# Patient Record
Sex: Female | Born: 1955 | Race: White | Hispanic: No | Marital: Married | Smoking: Never smoker
Health system: Southern US, Community
[De-identification: ages and names within clinical notes are randomized; demographics above are authoritative.]

## PROBLEM LIST (undated history)

## (undated) DIAGNOSIS — C801 Malignant (primary) neoplasm, unspecified: Secondary | ICD-10-CM

## (undated) HISTORY — PX: LUNG SURGERY: SHX703

## (undated) HISTORY — PX: TONSILLECTOMY: SUR1361

## (undated) HISTORY — PX: APPENDECTOMY: SHX54

---

## 2020-06-11 ENCOUNTER — Emergency Department (HOSPITAL_COMMUNITY)
Admission: EM | Admit: 2020-06-11 | Discharge: 2020-06-11 | Disposition: A | Discharge status: Home / Self Care | Payer: Medicare Other | Attending: Emergency Medicine | Admitting: Emergency Medicine

## 2020-06-11 ENCOUNTER — Other Ambulatory Visit: Payer: Self-pay

## 2020-06-11 ENCOUNTER — Encounter (HOSPITAL_COMMUNITY): Payer: Self-pay | Admitting: Emergency Medicine

## 2020-06-11 DIAGNOSIS — M79604 Pain in right leg: Secondary | ICD-10-CM | POA: Diagnosis present

## 2020-06-11 DIAGNOSIS — Z955 Presence of coronary angioplasty implant and graft: Secondary | ICD-10-CM | POA: Insufficient documentation

## 2020-06-11 DIAGNOSIS — G8918 Other acute postprocedural pain: Secondary | ICD-10-CM | POA: Diagnosis not present

## 2020-06-11 DIAGNOSIS — Z5321 Procedure and treatment not carried out due to patient leaving prior to being seen by health care provider: Secondary | ICD-10-CM | POA: Insufficient documentation

## 2020-06-11 HISTORY — DX: Malignant (primary) neoplasm, unspecified: C80.1

## 2020-06-11 NOTE — ED Triage Notes (Signed)
Pt had a stent placed in her lung at [facility]. Pt c/o left leg pain that began after her surgery.

## 2020-06-12 ENCOUNTER — Emergency Department (HOSPITAL_COMMUNITY)
Admission: EM | Admit: 2020-06-12 | Discharge: 2020-06-12 | Disposition: A | Discharge status: Home / Self Care | Payer: Medicare Other | Attending: Emergency Medicine | Admitting: Emergency Medicine

## 2020-06-12 ENCOUNTER — Emergency Department (HOSPITAL_COMMUNITY): Payer: Medicare Other

## 2020-06-12 ENCOUNTER — Emergency Department (HOSPITAL_COMMUNITY)
Admission: EM | Admit: 2020-06-12 | Discharge: 2020-06-12 | Discharge status: Absent without leave | Payer: Medicare Other

## 2020-06-12 DIAGNOSIS — Z85841 Personal history of malignant neoplasm of brain: Secondary | ICD-10-CM | POA: Insufficient documentation

## 2020-06-12 DIAGNOSIS — Z85118 Personal history of other malignant neoplasm of bronchus and lung: Secondary | ICD-10-CM | POA: Insufficient documentation

## 2020-06-12 DIAGNOSIS — M79605 Pain in left leg: Secondary | ICD-10-CM

## 2020-06-12 DIAGNOSIS — R52 Pain, unspecified: Secondary | ICD-10-CM

## 2020-06-12 NOTE — ED Provider Notes (Signed)
[facility] EMERGENCY DEPARTMENT Provider Note   CSN: [id number] Arrival date & time: 06/12/20  1501     History Chief Complaint  Patient presents with  . Leg Pain    Holly Hill is a 64 y.o. female.  HPI    Patient with significant medical history of adenocarcinoma of the right lung, with right mainstem stent presents to the emergency department with chief complaint of left upper thigh pain.  Patient states pain started after her surgery in October (stent and right systemic lung), she describes a consistent throbbing sensation in her left upper thigh, pain does not radiate, it increases when she applies weight to it or touches it, she denies paresthesias or weakness in her lower extremities.  She denies recent injury to the area, has no history of DVTs or blood clots, denies any recent leg swelling.  She has been taking ibuprofen and Tylenol without any relief.  She denies fevers, chills, chest pain, shortness of breath, abdominal pain, flank pain, difficulty with urination, vaginal bleeding no vaginal discharge.  Patient states she is never experienced this before and has no alleviating factors.  Past Medical History:  Diagnosis Date  . Cancer ([place])    lung and brain     There are no problems to display for this patient.   Past Surgical History:  Procedure Laterality Date  . APPENDECTOMY    . LUNG SURGERY    . TONSILLECTOMY       OB History   No obstetric history on file.     No family history on file.  Social History   Tobacco Use  . Smoking status: Never Smoker  . Smokeless tobacco: Never Used  Vaping Use  . Vaping Use: Never used  Substance Use Topics  . Alcohol use: Never  . Drug use: Never    Home Medications Prior to Admission medications   Not on File    Allergies    Red dye  Review of Systems   Review of Systems  Constitutional: Negative for chills and fever.  HENT: Negative for congestion.   Eyes: Negative for visual disturbance.   Respiratory: Negative for shortness of breath.   Cardiovascular: Negative for chest pain.  Gastrointestinal: Negative for abdominal pain, diarrhea, nausea and vomiting.  Genitourinary: Negative for dysuria, enuresis and flank pain.  Musculoskeletal: Negative for back pain.       Patient midst to left thigh pain.  Skin: Negative for rash.  Neurological: Negative for headaches.  Hematological: Does not bruise/bleed easily.    Physical Exam Updated Vital Signs BP 116/74 (BP Location: Left Arm)   Pulse (!) 120   Temp 98 F (36.7 C) (Oral)   Resp 18   SpO2 100%   Physical Exam Vitals and nursing note reviewed.  Constitutional:      General: She is not in acute distress.    Appearance: She is not ill-appearing.  HENT:     Head: Normocephalic and atraumatic.     Nose: No congestion.  Eyes:     Conjunctiva/sclera: Conjunctivae normal.  Cardiovascular:     Rate and Rhythm: Regular rhythm. Tachycardia present.     Pulses: Normal pulses.     Heart sounds: No murmur heard. No friction rub. No gallop.   Pulmonary:     Effort: No respiratory distress.     Breath sounds: No wheezing, rhonchi or rales.  Abdominal:     Palpations: Abdomen is soft.     Tenderness: There is no abdominal tenderness.  Musculoskeletal:        General: Tenderness present.     Right lower leg: No edema.     Left lower leg: No edema.     Comments: Patient's left leg was evaluated she had tenderness to palpation along the anterior aspect of her mid to proximal femur, no deformities or crepitus noted.  She had full range of motion at her hip flexor, knee, ankle, toes.  Neurovascular fully intact bilaterally.  Patient is moving all 4 extremities out difficulty.  Skin:    General: Skin is warm and dry.  Neurological:     Mental Status: She is alert.     Comments: Patient is having no difficulty with word finding.  Psychiatric:        Mood and Affect: Mood normal.     ED Results / Procedures /  Treatments   Labs (all labs ordered are listed, but only abnormal results are displayed) Labs Reviewed - No data to display  EKG None  Radiology US Venous Img Lower Unilateral Left  Result Date: 06/12/2020 CLINICAL DATA:  64 year old female with left calf pain. History of recent thoracic surgery. EXAM: LEFT LOWER EXTREMITY VENOUS DOPPLER ULTRASOUND TECHNIQUE: Gray-scale sonography with graded compression, as well as color Doppler and duplex ultrasound were performed to evaluate the left lower extremity deep venous systems from the level of the common femoral vein and including the common femoral, femoral, profunda femoral, popliteal and calf veins including the posterior tibial, peroneal and gastrocnemius veins when visible. Spectral Doppler was utilized to evaluate flow at rest and with distal augmentation maneuvers in the common femoral, femoral and popliteal veins. The contralateral common femoral vein was also evaluated for comparison. COMPARISON:  None. FINDINGS: LEFT LOWER EXTREMITY Common Femoral Vein: No evidence of thrombus. Normal compressibility, respiratory phasicity and response to augmentation. Central Greater Saphenous Vein: No evidence of thrombus. Normal compressibility and flow on color Doppler imaging. Central Profunda Femoral Vein: No evidence of thrombus. Normal compressibility and flow on color Doppler imaging. Femoral Vein: No evidence of thrombus. Normal compressibility, respiratory phasicity and response to augmentation. Popliteal Vein: No evidence of thrombus. Normal compressibility, respiratory phasicity and response to augmentation. Calf Veins: No evidence of thrombus. Normal compressibility and flow on color Doppler imaging. Other Findings: Just superficial to the lateral aspect of the proximal femur in the clinical area of concern is a poorly defined heterogeneously hypoechoic mass with a few wavy internal echoes and internal vascularity on color Doppler. The mass measures  approximately 9.2 x 1.7 x 3.7 cm and is adjacent to the femoral periosteum. RIGHT LOWER EXTREMITY Common Femoral Vein: No evidence of thrombus. Normal compressibility, respiratory phasicity and response to augmentation. IMPRESSION: 1. No evidence of left lower extremity deep vein thrombosis. 2. Indeterminate heterogeneously hypoechoic mass adjacent to the lateral aspect of the proximal femoral periosteum. Recommend femur radiographs for initial additional evaluation. MRI of the left lower extremity should be considered if radiographs are non-revealing. Ruthann Cancer, MD [facility] Radiology Electronically Signed   By: Ruthann Cancer MD   On: 06/12/2020 16:52   DG FEMUR MIN 2 VIEWS LEFT  Result Date: 06/12/2020 CLINICAL DATA:  Left thigh pain. Started after lung surgery in October due to cancer. EXAM: LEFT FEMUR 2 VIEWS COMPARISON:  None. FINDINGS: Aggressive appearing lesion of the proximal to mid femoral shaft involving the cortex and medulla. No acute displaced pathologic fracture through the lesion. No acute displaced fracture of the remainder of the visualized left  femur. Visualized portions of the left hip are grossly unremarkable with no dislocation. Visualized portions of the left knee demonstrate degenerative changes. Soft tissues are grossly unremarkable. IMPRESSION: Aggressive appearing lesion of the proximal to mid femoral shaft involving the cortex and medulla. Finding concerning for malignancy. Recommend MRI with and without contrast for further evaluation. Electronically Signed   By: Iven Finn M.D.   On: 06/12/2020 17:55    Procedures Procedures (including critical care time)  Medications Ordered in ED Medications - No data to display  ED Course  I have reviewed the triage vital signs and the nursing notes.  Pertinent labs & imaging results that were available during my care of the patient were reviewed by me and considered in my  medical decision making (see chart for details).  Clinical Course as of 06/12/20 1847  Thu Jun 12, 2020  1845 64 yo female here with left hip and femur pain gradual onset for 2 months, persistent, no falls or trauma.  DVT ultrasound negative.  Xray shows lucency concerning for malignancy.  She can ambulate and bear full weight.  She uses a cane for support.  Discussed outpt f/u with her own orthopedist (in [place]) or PCP for an MRI of the hip to evaluate for suspected malignancy and rule out small occult fx.  She verbalized understadning. [MT]    Clinical Course User Index [MT] Trifan, Carola Rhine, MD   MDM Rules/Calculators/A&P                          Patient presents with left thigh pain.  She is alert, does not appear in acute distress, vital signs sent for tachycardia.  Will obtain DVT study of left leg for further evaluation.  DVT study negative for left lower extremity DVT, does show indeterminate heterogeneous hypoectatic mass adjacent to the lateral aspect of the proximal femur.  Recommends x-ray for further evaluation.  X-ray of left femur shows aggressively.  Lesion of the proximal to mid femur shaft involving the cortex and medulla.  Findings concerning for possible malignancy.  I have low suspicion for fractures or DVTs as imaging is negative for both.  Low suspicion for compartment syndrome as area was soft, neurovascular fully intact bilaterally.  Low suspicion for CVA or intracranial head bleed, increased cranial metastases as there is no neuro deficits noted on my exam, patient denies headaches, change in vision, paresthesia weakness upper or lower extremities.  Low suspicion for abscess or cellulitis as area was evaluated there is no signs of infection on my exam.  I suspect patient's pain is secondary to possible metastases.  Will recommend that she follows up with her oncologist and orthopedist for further evaluation management.  Vital signs have remained stable, no indication  for hospital admission.  Patient discussed with attending and they agreed with assessment and plan.  Patient given at home care as well strict return precautions.  Patient verbalized that they understood agreed to said plan.   Final Clinical Impression(s) / ED Diagnoses Final diagnoses:  Left leg pain    Rx / DC Orders ED Discharge Orders    None       Marcello Fennel, PA-C 06/12/20 1847    Wyvonnia Dusky, MD 06/13/20 1126

## 2020-06-12 NOTE — Discharge Instructions (Addendum)
Seen here for left femur pain.  Imaging concerning for possible possible malignancy.  They recommend MRI without contrast for further evaluation.  I recommend continue with your home medication as prescribed.  You may take over-the-counter pain medication like ibuprofen or Tylenol every 6 hours as needed please follow dosing back of bottle.  I want you to follow-up with your orthopedic and oncologist for further evaluation management.  Come back to the emergency department if you develop chest pain, shortness of breath, severe abdominal pain, uncontrolled nausea, vomiting, diarrhea.

## 2020-06-12 NOTE — ED Notes (Signed)
Pt in [place]- no signature pad . Pt rec'd d/c papers

## 2020-06-12 NOTE — ED Triage Notes (Signed)
Pain in left thigh

## 2020-09-08 ENCOUNTER — Encounter (HOSPITAL_COMMUNITY): Payer: Self-pay | Admitting: *Deleted

## 2020-09-08 ENCOUNTER — Emergency Department (HOSPITAL_COMMUNITY)
Admission: EM | Admit: 2020-09-08 | Discharge: 2020-09-09 | Disposition: A | Discharge status: Home / Self Care | Payer: Medicare Other | Attending: Emergency Medicine | Admitting: Emergency Medicine

## 2020-09-08 ENCOUNTER — Emergency Department (HOSPITAL_COMMUNITY): Payer: Medicare Other

## 2020-09-08 ENCOUNTER — Other Ambulatory Visit: Payer: Self-pay

## 2020-09-08 DIAGNOSIS — R509 Fever, unspecified: Secondary | ICD-10-CM

## 2020-09-08 DIAGNOSIS — Z79899 Other long term (current) drug therapy: Secondary | ICD-10-CM | POA: Diagnosis not present

## 2020-09-08 DIAGNOSIS — E876 Hypokalemia: Secondary | ICD-10-CM

## 2020-09-08 DIAGNOSIS — R000 Tachycardia, unspecified: Secondary | ICD-10-CM | POA: Insufficient documentation

## 2020-09-08 DIAGNOSIS — C3490 Malignant neoplasm of unspecified part of unspecified bronchus or lung: Secondary | ICD-10-CM | POA: Insufficient documentation

## 2020-09-08 DIAGNOSIS — D649 Anemia, unspecified: Secondary | ICD-10-CM

## 2020-09-08 DIAGNOSIS — R748 Abnormal levels of other serum enzymes: Secondary | ICD-10-CM | POA: Diagnosis not present

## 2020-09-08 DIAGNOSIS — R0602 Shortness of breath: Secondary | ICD-10-CM | POA: Insufficient documentation

## 2020-09-08 DIAGNOSIS — E871 Hypo-osmolality and hyponatremia: Secondary | ICD-10-CM | POA: Diagnosis not present

## 2020-09-08 DIAGNOSIS — C7951 Secondary malignant neoplasm of bone: Secondary | ICD-10-CM | POA: Diagnosis not present

## 2020-09-08 DIAGNOSIS — R300 Dysuria: Secondary | ICD-10-CM | POA: Diagnosis not present

## 2020-09-08 DIAGNOSIS — C7931 Secondary malignant neoplasm of brain: Secondary | ICD-10-CM | POA: Insufficient documentation

## 2020-09-08 DIAGNOSIS — T451X5A Adverse effect of antineoplastic and immunosuppressive drugs, initial encounter: Secondary | ICD-10-CM

## 2020-09-08 DIAGNOSIS — D701 Agranulocytosis secondary to cancer chemotherapy: Secondary | ICD-10-CM | POA: Diagnosis not present

## 2020-09-08 LAB — COMPREHENSIVE METABOLIC PANEL
ALT: 31 U/L (ref 0–44)
AST: 47 U/L — ABNORMAL HIGH (ref 15–41)
Albumin: 3 g/dL — ABNORMAL LOW (ref 3.5–5.0)
Alkaline Phosphatase: 181 U/L — ABNORMAL HIGH (ref 38–126)
Anion gap: 12 (ref 5–15)
BUN: 18 mg/dL (ref 8–23)
CO2: 26 mmol/L (ref 22–32)
Calcium: 8.2 mg/dL — ABNORMAL LOW (ref 8.9–10.3)
Chloride: 92 mmol/L — ABNORMAL LOW (ref 98–111)
Creatinine, Ser: 1.01 mg/dL — ABNORMAL HIGH (ref 0.44–1.00)
GFR, Estimated: >60 mL/min (ref >60)
Glucose, Bld: 121 mg/dL — ABNORMAL HIGH (ref 70–99)
Potassium: 3.4 mmol/L — ABNORMAL LOW (ref 3.5–5.1)
Sodium: 130 mmol/L — ABNORMAL LOW (ref 135–145)
Total Bilirubin: 0.8 mg/dL (ref 0.3–1.2)
Total Protein: 7.5 g/dL (ref 6.5–8.1)

## 2020-09-08 LAB — CBC WITH DIFFERENTIAL/PLATELET
Abs Immature Granulocytes: 0.03 10*3/uL (ref 0.00–0.07)
Basophils Absolute: 0 10*3/uL (ref 0.0–0.1)
Basophils Relative: 1 %
Eosinophils Absolute: 0 10*3/uL (ref 0.0–0.5)
Eosinophils Relative: 0 %
HCT: 26 % — ABNORMAL LOW (ref 36.0–46.0)
Hemoglobin: 8.5 g/dL — ABNORMAL LOW (ref 12.0–15.0)
Immature Granulocytes: 1 %
Lymphocytes Relative: 15 %
Lymphs Abs: 0.4 10*3/uL — ABNORMAL LOW (ref 0.7–4.0)
MCH: 29.7 pg (ref 26.0–34.0)
MCHC: 32.7 g/dL (ref 30.0–36.0)
MCV: 90.9 fL (ref 80.0–100.0)
Monocytes Absolute: 1 10*3/uL (ref 0.1–1.0)
Monocytes Relative: 33 %
Neutro Abs: 1.5 10*3/uL — ABNORMAL LOW (ref 1.7–7.7)
Neutrophils Relative %: 50 %
Platelets: 83 10*3/uL — ABNORMAL LOW (ref 150–400)
RBC: 2.86 MIL/uL — ABNORMAL LOW (ref 3.87–5.11)
RDW: 12.9 % (ref 11.5–15.5)
WBC: 3 10*3/uL — ABNORMAL LOW (ref 4.0–10.5)
nRBC: 0 % (ref 0.0–0.2)

## 2020-09-08 LAB — APTT: aPTT: 40 seconds — ABNORMAL HIGH (ref 24–36)

## 2020-09-08 LAB — PROTIME-INR
INR: 1.2 (ref 0.8–1.2)
Prothrombin Time: 14.8 seconds (ref 11.4–15.2)

## 2020-09-08 MED ORDER — LACTATED RINGERS IV BOLUS (SEPSIS)
1000.0000 mL | Freq: Once | INTRAVENOUS | Status: AC
  Administered 2020-09-08: 1000 mL via INTRAVENOUS

## 2020-09-08 NOTE — ED Triage Notes (Signed)
104 fever at home per pt.  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   Believes first chemo was March 1.

## 2020-09-08 NOTE — ED Provider Notes (Signed)
[facility] EMERGENCY DEPARTMENT Provider Note   CSN: [id number] Arrival date & time: 09/08/20  2233   History Chief Complaint  Patient presents with  . Fever    Holly Hill is a 65 y.o. female.  The history is provided by the patient.  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  She had her first round of chemotherapy on 3/1.  3 days ago, she had low-grade fevers as high as 99.0.  2 days ago, she had drenching night sweats.  Today, she had fever which got as high as 104 with associated chills and sweats.  She has not noticed any change in her baseline dyspnea or cough.  She has had some slight dysuria.  There has been no nausea or vomiting.  She denies any sick contacts.  She is unsure if she has been vaccinated against COVID-19.  She did take a dose of acetaminophen at home prior to coming to the ED.  Past Medical History:  Diagnosis Date  . Cancer ([place])    lung and brain     There are no problems to display for this patient.   Past Surgical History:  Procedure Laterality Date  . APPENDECTOMY    . LUNG SURGERY    . TONSILLECTOMY       OB History   No obstetric history on file.     History reviewed. No pertinent family history.  Social History   Tobacco Use  . Smoking status: Never Smoker  . Smokeless tobacco: Never Used  Vaping Use  . Vaping Use: Never used  Substance Use Topics  . Alcohol use: Never  . Drug use: Never    Home Medications Prior to Admission medications   Not on File    Allergies    Red dye  Review of Systems   Review of Systems  Constitutional: Positive for fever.  All other systems reviewed and are negative.   Physical Exam Updated Vital Signs BP 108/74 (BP Location: Left Arm)   Pulse (!) 115   Temp 97.8 F (36.6 C) (Oral)   Resp 20   Ht 5\' 7"  (1.702 m)   Wt 61.2 kg   SpO2 98%   BMI 21.14 kg/m   Physical Exam Vitals and nursing note reviewed.   65 year old  female, appears mildly dyspneic at rest, but is in no acute distress. Vital signs are significant for rapid heart rate. Oxygen saturation is 98%, which is normal. Head is normocephalic and atraumatic. PERRLA, EOMI. Oropharynx is clear. Neck is nontender and supple without adenopathy or JVD. Back is nontender and there is no CVA tenderness. Lungs are clear without rales, wheezes, or rhonchi. Chest is nontender. Heart has regular rate and rhythm without murmur. Abdomen is soft, flat, nontender without masses or hepatosplenomegaly and peristalsis is normoactive. Extremities have no cyanosis or edema, full range of motion is present. Skin is warm and dry without rash. Neurologic: Mental status is normal, cranial nerves are intact, there are no motor or sensory deficits.  ED Results / Procedures / Treatments   Labs (all labs ordered are listed, but only abnormal results are displayed) Labs Reviewed  COMPREHENSIVE METABOLIC PANEL - Abnormal; Notable for the following components:      Result Value   Sodium 130 (*)    Potassium 3.4 (*)    Chloride 92 (*)    Glucose, Bld 121 (*)    Creatinine, Ser 1.01 (*)  Calcium 8.2 (*)    Albumin 3.0 (*)    AST 47 (*)    Alkaline Phosphatase 181 (*)    All other components within normal limits  CBC WITH DIFFERENTIAL/PLATELET - Abnormal; Notable for the following components:   WBC 3.0 (*)    RBC 2.86 (*)    Hemoglobin 8.5 (*)    HCT 26.0 (*)    Platelets 83 (*)    Neutro Abs 1.5 (*)    Lymphs Abs 0.4 (*)    All other components within normal limits  APTT - Abnormal; Notable for the following components:   aPTT 40 (*)    All other components within normal limits  URINALYSIS, ROUTINE W REFLEX MICROSCOPIC - Abnormal; Notable for the following components:   APPearance HAZY (*)    Hgb urine dipstick MODERATE (*)    Protein, ur 30 (*)    Bacteria, UA RARE (*)    All other components within normal limits  URINE CULTURE  CULTURE, BLOOD (ROUTINE X  2)  CULTURE, BLOOD (ROUTINE X 2)  LACTIC ACID, PLASMA  PROTIME-INR    EKG EKG Interpretation  Date/Time:  Monday September 08 2020 23:38:50 EDT Ventricular Rate:  115 PR Interval:    QRS Duration: 94 QT Interval:  384 QTC Calculation: 532 R Axis:   44 Text Interpretation: Sinus or ectopic atrial tachycardia Prolonged QT interval Baseline wander in lead(s) II III aVF V4 Low voltage QRS No old tracing to compare Confirmed by Delora Fuel ([id number]) on 09/08/2020 11:49:41 PM   Radiology DG Chest Port 1 View  Result Date: 09/08/2020 CLINICAL DATA:  History of lung cancer with fever. EXAM: PORTABLE CHEST 1 VIEW COMPARISON:  None. FINDINGS: The lungs are hyperinflated. Patchy opacities are seen overlying the right perihilar region and medial aspect of the upper right lung. Mild airspace disease is suspected along the medial aspect of the right lung base. Mild right-sided volume loss is noted. A radiopaque stent is seen projecting over the expected region of the right mainstem bronchus. There is a very small right pleural effusion. No pneumothorax is identified. The heart size and mediastinal contours are within normal limits. The visualized skeletal structures are unremarkable. IMPRESSION: 1. Right perihilar and upper right lung opacities, which may represent multifocal infiltrates. Correlation with chest CT is recommended, as an underlying neoplasm cannot be excluded. 2. Very small right pleural effusion. 3. Right mainstem bronchus stent in place. Electronically Signed   By: Virgina Norfolk M.D.   On: 09/08/2020 23:44    Procedures Procedures   Medications Ordered in ED Medications - No data to display  ED Course  I have reviewed the triage vital signs and the nursing notes.  Pertinent labs & imaging results that were available during my care of the patient were reviewed by me and considered in my medical decision making (see chart for details).  MDM Rules/Calculators/A&P Fever in a patient  who is approximately 2 weeks status post chemotherapy infusion.  Old records are reviewed confirming history of lung cancer and received infusions of appetite and, bevacizumab, carboplatin, pemetrexed on 3/1.  Temperature is normal here, but she had received acetaminophen at home and she is tachycardic.  She is started on the evolving sepsis pathway, but code sepsis is not initiated at this point.  If she is neutropenic, she will need to be admitted for IV antibiotics.  Chest x-ray shows changes from known lung cancer but no obvious infiltrate.  ECG does show mild prolongation of QT interval.  WBC is 3.0 with absolute neutrophil count of 1.5.  Although this is low, it is not critically low.  Lactic acid level is normal.  She has mild hyponatremia and hypokalemia and is given a dose of supplemental potassium.  Elevated alkaline phosphatase is probably secondary to known bone metastases.  Minor elevation of AST is not felt to be clinically significant.  Hemoglobin is 8.5 which is slightly lower than it had been, and will need to be followed by her oncologist.  At this point, I do not see an indication for admission.  Urinalysis is still pending.  She did feel significantly better following IV fluids.  Urinalysis is unremarkable.  At this point, no source of infection is seen, and her absolute neutrophil count is adequate.  She is discharged with instructions to contact her oncologist in the morning.  If her absolute neutrophil count drops below 500, she will need to be admitted for antibiotics.  Final Clinical Impression(s) / ED Diagnoses Final diagnoses:  Fever, unspecified fever cause  Chemotherapy-induced neutropenia (HCC)  Normochromic normocytic anemia  Hyponatremia  Hypokalemia  Alkaline phosphatase elevation    Rx / DC Orders ED Discharge Orders    None       Delora Fuel, MD 37/85/88 [id number]

## 2020-09-09 LAB — URINALYSIS, ROUTINE W REFLEX MICROSCOPIC
Bilirubin Urine: NEGATIVE
Glucose, UA: NEGATIVE mg/dL
Ketones, ur: NEGATIVE mg/dL
Leukocytes,Ua: NEGATIVE
Nitrite: NEGATIVE
Protein, ur: 30 mg/dL — AB
Specific Gravity, Urine: 1.018 (ref 1.005–1.030)
pH: 5 (ref 5.0–8.0)

## 2020-09-09 LAB — LACTIC ACID, PLASMA: Lactic Acid, Venous: 1.2 mmol/L (ref 0.5–1.9)

## 2020-09-09 MED ORDER — ONDANSETRON HCL 4 MG/2ML IJ SOLN
4.0000 mg | Freq: Once | INTRAMUSCULAR | Status: AC
  Administered 2020-09-09: 4 mg via INTRAVENOUS
  Filled 2020-09-09: qty 2

## 2020-09-09 MED ORDER — POTASSIUM CHLORIDE CRYS ER 20 MEQ PO TBCR
40.0000 meq | EXTENDED_RELEASE_TABLET | Freq: Once | ORAL | Status: DC
  Filled 2020-09-09: qty 2

## 2020-09-09 MED ORDER — POTASSIUM CHLORIDE 20 MEQ PO PACK
40.0000 meq | PACK | Freq: Every day | ORAL | Status: DC
  Administered 2020-09-09: 40 meq via ORAL
  Filled 2020-09-09: qty 2

## 2020-09-09 MED ORDER — LACTATED RINGERS IV BOLUS
1000.0000 mL | Freq: Once | INTRAVENOUS | Status: AC
  Administered 2020-09-09: 1000 mL via INTRAVENOUS

## 2020-09-09 NOTE — Discharge Instructions (Signed)
Your WBC is low because of your recent chemotherapy, but it is not critical.  WBC was 3.0 and ANC 1.5.  Continue to take acetaminophen as needed for fever, drink plenty of fluids.  Return if symptoms are getting worse.  Please call your oncologist in the morning to let her know that you had a fever tonight and tell her what your WBC and ANC are.

## 2020-09-10 LAB — URINE CULTURE: Culture: NO GROWTH

## 2020-09-13 LAB — CULTURE, BLOOD (ROUTINE X 2)
Culture: NO GROWTH
Special Requests: ADEQUATE

## 2020-09-14 LAB — CULTURE, BLOOD (ROUTINE X 2)
Culture: NO GROWTH
Special Requests: ADEQUATE

## 2022-03-11 IMAGING — DX DG FEMUR 2+V*L*
4 series · 4 of 4 positions shown · non-contrast
Comparison: None.

CLINICAL DATA: Left thigh pain. Started after lung surgery in
[REDACTED] due to cancer.

EXAM:
LEFT FEMUR 2 VIEWS

[femur ap (1 of 2)]
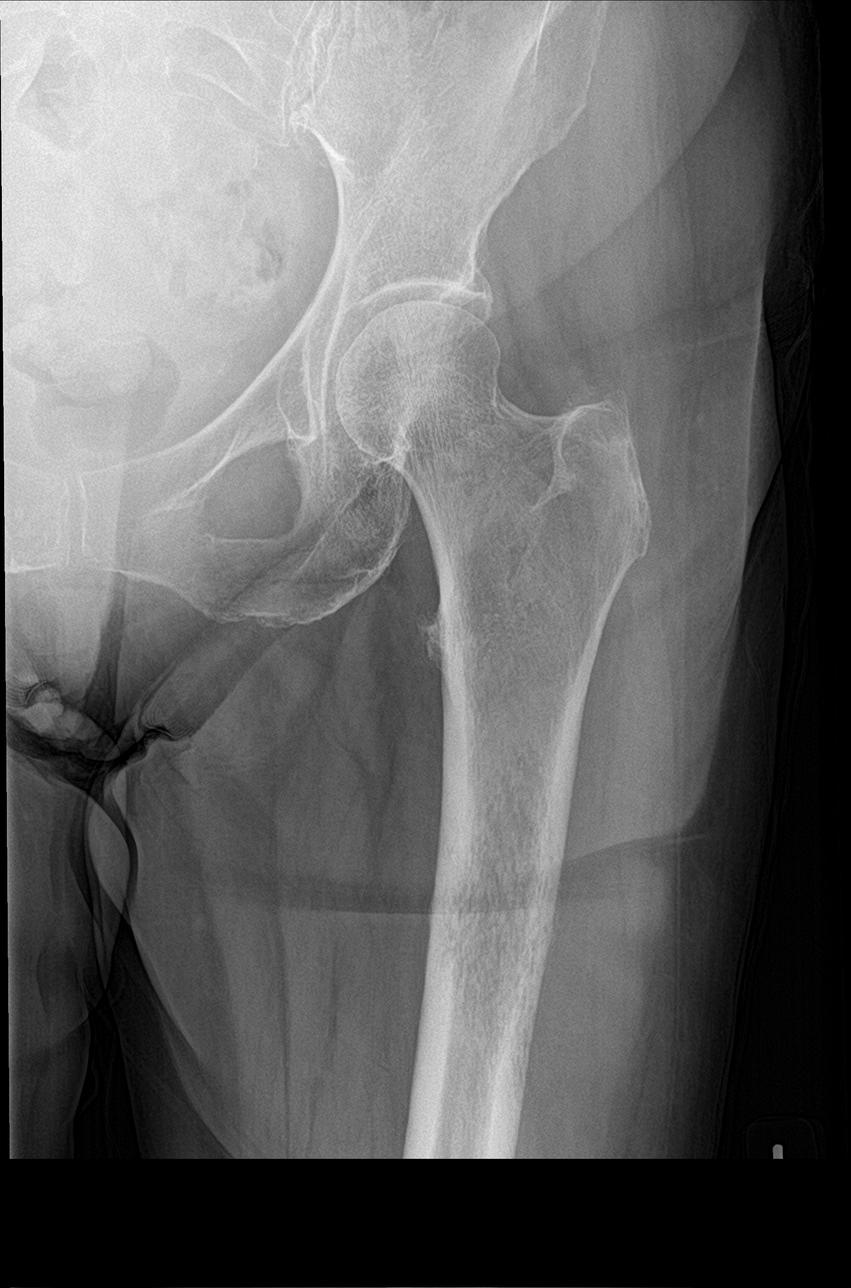

[femur ap (2 of 2)]
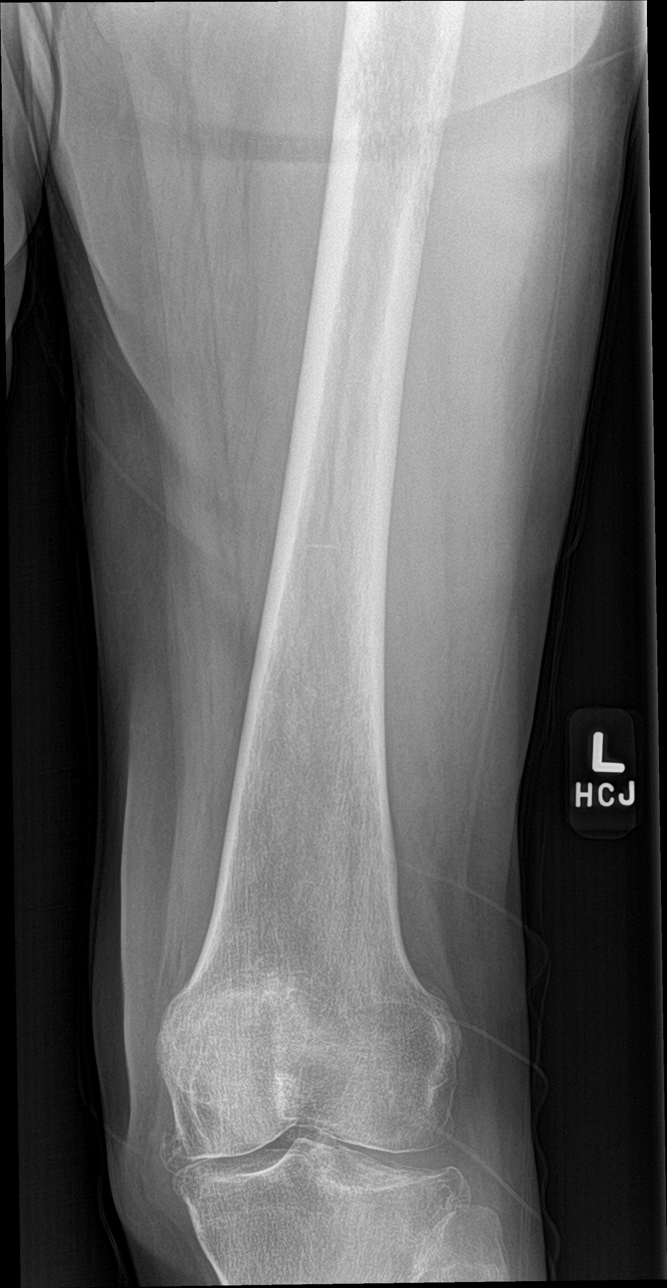

[femur lat (1 of 2)]
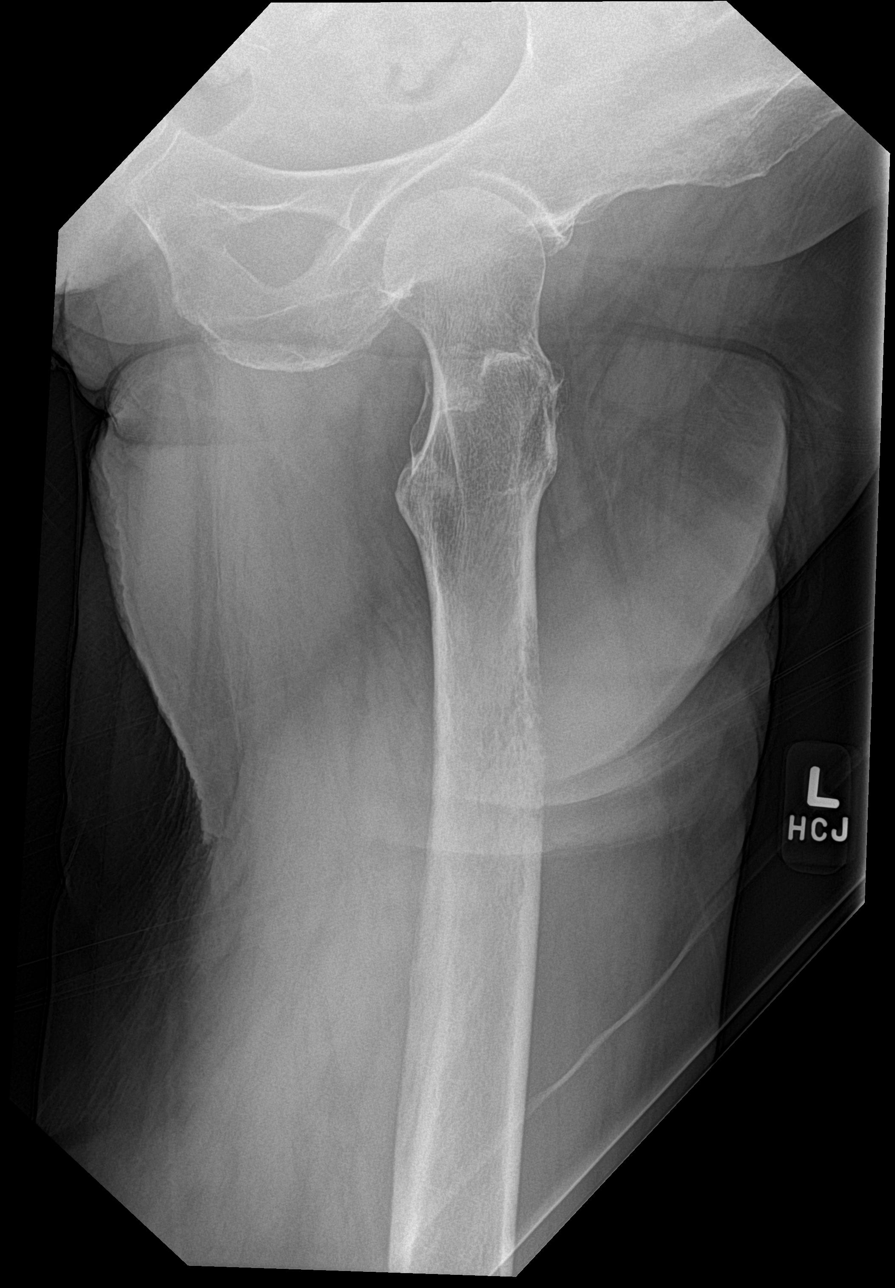

[femur lat (2 of 2)]
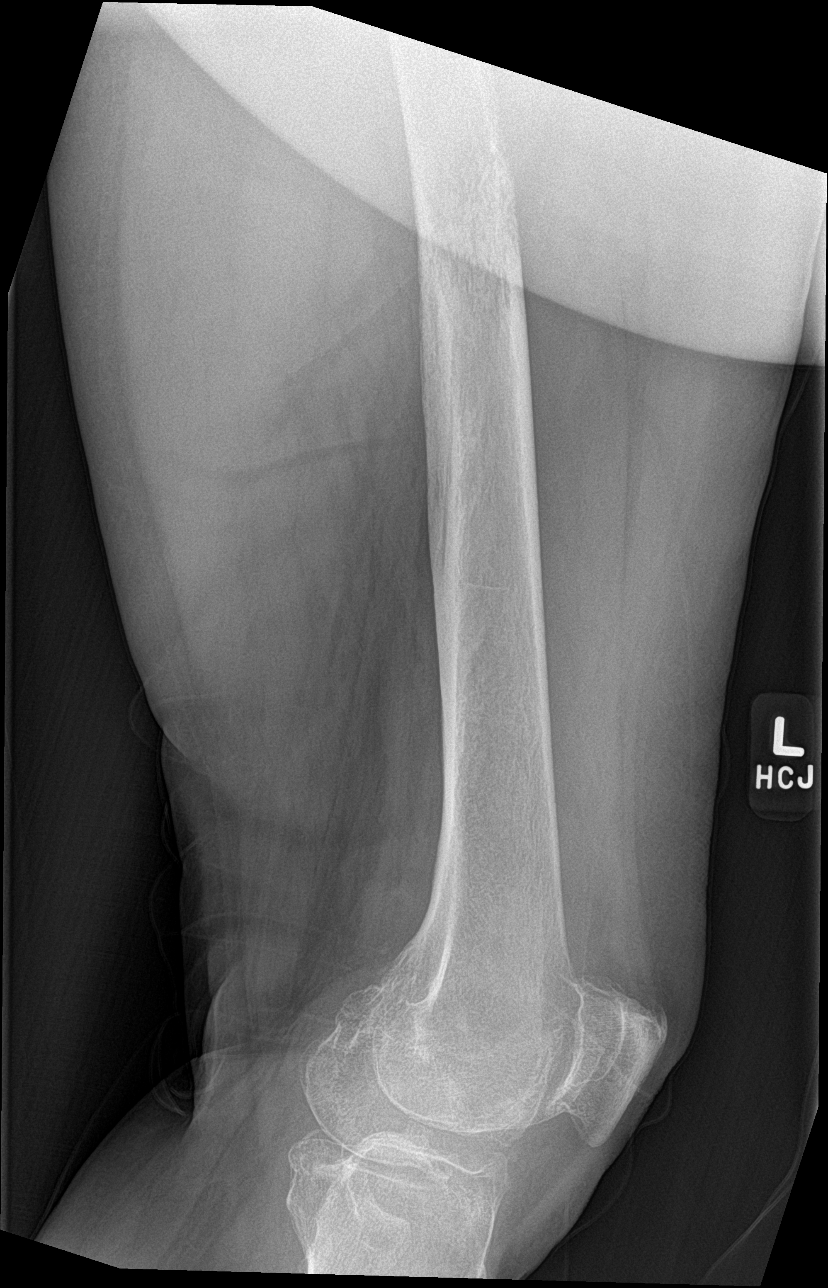

[4 of 4 positions shown; findings below may reference images not displayed]

FINDINGS: Aggressive appearing lesion of the proximal to mid femoral shaft
involving the cortex and medulla. No acute displaced pathologic
fracture through the lesion. No acute displaced fracture of the
remainder of the visualized left femur.

Visualized portions of the left hip are grossly unremarkable with no
dislocation. Visualized portions of the left knee demonstrate
degenerative changes.

Soft tissues are grossly unremarkable.
IMPRESSION: Aggressive appearing lesion of the proximal to mid femoral shaft
involving the cortex and medulla. Finding concerning for malignancy.
Recommend MRI with and without contrast for further evaluation.

## 2023-05-29 DEATH — deceased
